# Patient Record
Sex: Female | Born: 1980 | Race: Black or African American | Hispanic: No | Marital: Married | Smoking: Never smoker
Health system: Southern US, Community
[De-identification: ages and names within clinical notes are randomized; demographics above are authoritative.]

## PROBLEM LIST (undated history)

## (undated) DIAGNOSIS — A048 Other specified bacterial intestinal infections: Secondary | ICD-10-CM

## (undated) DIAGNOSIS — K589 Irritable bowel syndrome without diarrhea: Secondary | ICD-10-CM

## (undated) HISTORY — PX: TUBAL LIGATION: SHX77

---

## 2007-01-31 ENCOUNTER — Other Ambulatory Visit: Admission: RE | Admit: 2007-01-31 | Discharge: 2007-01-31 | Payer: Self-pay | Admitting: Obstetrics and Gynecology

## 2007-04-16 ENCOUNTER — Inpatient Hospital Stay (HOSPITAL_COMMUNITY): Admission: AD | Admit: 2007-04-16 | Discharge: 2007-04-16 | Payer: Self-pay | Admitting: Obstetrics and Gynecology

## 2007-04-23 ENCOUNTER — Ambulatory Visit (HOSPITAL_COMMUNITY): Admission: RE | Admit: 2007-04-23 | Discharge: 2007-04-23 | Payer: Self-pay | Admitting: Obstetrics and Gynecology

## 2007-12-03 ENCOUNTER — Inpatient Hospital Stay (HOSPITAL_COMMUNITY): Admission: RE | Admit: 2007-12-03 | Discharge: 2007-12-06 | Payer: Self-pay | Admitting: Obstetrics and Gynecology

## 2007-12-03 ENCOUNTER — Encounter (INDEPENDENT_AMBULATORY_CARE_PROVIDER_SITE_OTHER): Payer: Self-pay | Admitting: Obstetrics and Gynecology

## 2010-09-13 NOTE — H&P (Signed)
NAME:  Jade Mason, Jade Mason                ACCOUNT NO.:  [id number]   MEDICAL RECORD NO.:  [id number]          PATIENT TYPE:  INP   LOCATION:  NA                            [facility]   PHYSICIAN:  Naima A. Dillard, M.D. DATE OF BIRTH:  1980-07-22   DATE OF ADMISSION:  DATE OF DISCHARGE:                              HISTORY & PHYSICAL   CHIEF COMPLAINT:  Term, desires repeat cesarean section and  sterilization.   HISTORY OF PRESENT ILLNESS:  The patient is a 30 year old gravida 5,  para 1-0-3-1, who presents at 39 weeks for repeat cesarean section and  bilateral tubal ligation.  The patient entered pregnancy care at 10  weeks.  Pregnancy has been complicated by a history of a previous  cesarean section.  The patient was offered a cesarean section versus  VBAC and decided to have a repeat cesarean section with tubal ligation.   PRENATAL LABORATORY DATA:  O positive, Rh negative.  Starting hemoglobin  was 12.1 with 414 for platelets.  Sickle cell trait is negative.  RPR is  nonreactive.  Rubella is immune.  Hepatitis B surface antigen is  negative.  HIV is nonreactive.  GC and Chlamydia both negative.  Pap in  October of 2008 was within normal limits and GBS was negative.   PAST OBSTETRICAL HISTORY:  In February of 2005 she had a cesarean  section with delivery of a little girl born with a weight of 8 pounds 8  ounces secondary to fetal distress.  She had an EAB in 2006 with  methotrexate.  In 2006 she had a spontaneous abortion.  In 2007 she had  a spontaneous abortion with a D&C.   PAST MEDICAL HISTORY:  As above.  She has mild asthma.  No history of  intubation.   PAST SURGICAL HISTORY:  Significant for a D&C and breast reduction.  Previous cesarean section.   PAST GYN HISTORY:  Menarche at age 23, occurring every 28 days and  lasting 4-5 days.  History of GC which was treated.  No history of  abnormal Pap smear.   ALLERGIES:  MONISTAT.   MEDICATIONS:  Prenatal vitamins.   FAMILY HISTORY:  Significant for hypertension in maternal grandfather.  Mother with a history of ovarian cancer and father with a history of  depression.   PHYSICAL EXAMINATION:  VITAL SIGNS:  The patient is 5 feet 6 inches and  weighs 190 pounds.  Blood pressure 120/52, pulse 70.  NEUROLOGY:  Within normal limits.  EXTREMITIES:  Trace edema bilaterally.  Thyroid is nontender and not  enlarged.  BREASTS:  No masses or nipple discharge.  HEART:  Regular rate and rhythm.  LUNGS:  Clear to auscultation bilaterally.  ABDOMEN:  Gravid, soft, and nontender measuring 38 cm.  PELVIC:  Gloved vaginal examination is within normal limits.  Cervix  examination is not recorded.  RECTAL:  Deferred.   ASSESSMENT:  1. Term.  2. Desires repeat cesarean section and tubal ligation.   All birth control was reviewed with the patient and the patient desires  tubal ligation.  She understands the failure  rate and risks of a  cesarean section and tubal ligation to be, but not limited to bleeding,  infection, damage to internal organs such as bowel, bladder, and major  blood vessels.  She also understands that if the tubal ligation was to  fail and she thought she was pregnant this could result in ectopic  pregnancy and she should seek help immediately if she thinks she is  pregnant.      Naima A. Normand Sloop, M.D.  Electronically Signed     NAD/MEDQ  D:  12/02/2007  T:  12/03/2007  Job:  [id number]

## 2010-09-13 NOTE — Discharge Summary (Signed)
Jade Mason, Jade Mason               ACCOUNT NO.:  [id number]   MEDICAL RECORD NO.:  [id number]          PATIENT TYPE:  INP   LOCATION:  9147                          [facility]   PHYSICIAN:  Naima A. Dillard, M.D. DATE OF BIRTH:  28-Jan-1981   DATE OF ADMISSION:  12/03/2007  DATE OF DISCHARGE:  12/06/2007                               DISCHARGE SUMMARY   DISCHARGING PHYSICIAN:  Osborn Coho, MD.   ADMISSION DIAGNOSES:  1. Intrauterine pregnancy at term.  2. Desires repeat cesarean section and sterilization.   DISCHARGE DIAGNOSES:  1. Intrauterine pregnancy at term.  2. Desires repeat cesarean section and sterilization.  3. Status post cesarean delivery of a female infant, Apgars 8 and 9.  4. Status post bilateral tubal ligation.   HOSPITAL PROCEDURES:  1. Spinal anesthesia.  2. Repeat low-transverse cesarean section.  3. Bilateral tubal ligation.  4. Lysis of adhesions.  5. Additional suturing of hemorrhagic incision.   HOSPITAL COURSE:  The patient was admitted for an elective repeat  cesarean section which was performed under spinal anesthesia by Dr.  Normand Sloop with Nigel Bridgeman and Dr. Gaynell Face assisting.  She was delivered  of a female infant, Apgars 8 and 9.  Baby was taken to nursery.  Complications included bleeding around the uterine incision requiring  Avitene.  The incision was oversewn and Avitene was applied to control  the bleeding.  The patient was taken to recovery and then to Mother Baby  Unit, where she received routine postop care.  On postop day #1, she was  out of bed, tolerating fluids and food.  IV and PCA were discontinued.  She was voiding after Foley removal without problems.  On postop day #2,  she was doing well up ad lib.  Vital signs were stable, she continued to  receive routine care.  On postop day #3, she was ready to go home.  Baby  was doing well, and she was bottle-feeding her infant.  Chest was clear.  Heart regular rate and rhythm.  Abdomen  was soft and appropriately  tender.  Incision was clean, dry, and intact.  Lochia was small.  Extremities within normal limits.  She was deemed to receive full  benefit of her hospital stay and was discharged home.   DISCHARGE MEDICATIONS:  1. Motrin 600 mg p.o. q.6 h p.r.n.  2. Tylox 1 to 2 p.o. q.4 h p.r.n.   DISCHARGE LABS:  White blood cell count 13.0, hemoglobin 10.4, and  platelets 205.   DISCHARGE INSTRUCTIONS:  Per [facility] handout.   DISCHARGE FOLLOWUP:  In 6 weeks or p.r.n.   CONDITION ON DISCHARGE:  Good      Marie L. Williams, C.N.M.      ______________________________  Pierre Bali Normand Sloop, M.D.    MLW/MEDQ  D:  12/06/2007  T:  12/07/2007  Job:  [id number]

## 2010-09-13 NOTE — H&P (Signed)
NAME:  Jade Mason, Jade Mason                ACCOUNT NO.:  [id number]   MEDICAL RECORD NO.:  [id number]          PATIENT TYPE:  INP   LOCATION:                                [facility]   PHYSICIAN:  Naima A. Dillard, M.D. DATE OF BIRTH:  [birth year]   DATE OF ADMISSION:  12/03/2007  DATE OF DISCHARGE:                              HISTORY & PHYSICAL   Jade Mason is a 30 year old gravida 5, para 1-0-3-1 at 42 weeks who  presents today for scheduled repeat cesarean section, bilateral tubal  ligation.  The patient's pregnancy has been remarkable for:  1. Previous cesarean section with desire for repeat.  2. Desire for tubal sterilization.  3. First trimester spotting.  4. History of breast reduction.  5. History of mild asthma.  6. Strong family history of uterine cancer.  7. History of 2 SABs is and 1 TAB.   PRENATAL LABS:  Blood type is O+, Rh antibody negative, urine  nonreactive, rubella titer positive, hepatitis B surface antigen  negative, HIV is nonreactive.  Sickle cell test was negative.  GC  chlamydia cultures were negative in November.  HSV-2 was negative in  April 2008.  Pap was normal in October 2008.  Hemoglobin upon entering  the practice was 12.1.  It was within normal limits at 28 weeks.  Glucola was normal.  The patient had first trimester screen that was  normal.  AFP was not noted on the record.  Group B strep culture was  negative at 36 weeks.   HISTORY OF PRESENT PREGNANCY:  The patient entered care at approximately  10 weeks.  She had a first trimester ultrasound at [facility] at 7 weeks.  She had some ligament issues in the first trimester.  She was given  Zithromax in the first trimester for upper respiratory infection, first  trimester screen was normal.  The patient about 14 weeks was planning a  repeat C-section and tubal.  AFP was noted that it was done but no  report is noted in the chart.  AFP was normal.  Another ultrasound was  done at 18 weeks showing normal  growth.  There are mild bilateral  pyelectasis.  She had another ultrasound at 27 weeks with pyelectasis  resolved and normal findings.  She had a sebaceous cyst noted in the  vagina at 27 weeks.  Her C-section was scheduled for December 03, 2007,  with Dr. Normand Sloop due to Dr. Stefano Gaul being out of town.  The rest of her  pregnancy was essentially uncomplicated.   OBSTETRICAL HISTORY:  In 2005 she had a primary low transverse cesarean  section for a female infant weight 8 pounds 8 ounces at 40 weeks.  She  was in labor 13 hours.  She has fetal distress is 7 cm.  That child was  born in [place].  In 2006 she had a TAB which she used methotrexate at  6 weeks 2006.  She also had a miscarriage that passed naturally without  complication and in 2007 she had an SAB that required and D and C at 7-8  weeks.  She did have increased bleeding after that.   MEDICAL HISTORY:  She is a previous patch and condom user.  She was  treated for [facility] in September 2008.  She reports usual childhood  illnesses.  She has history of seasonal asthma.  Has albuterol to use,  last attack was in 2006.   SURGICAL HISTORY:  Includes:  1. D and C x1.  2. Breast adduction 2003.  3. Previously noted C-section.   The patient is allergic to MONISTAT was causes a rash.   FAMILY HISTORY:  Her mother, father, maternal grandmother, maternal  grandfather, paternal grandmother and paternal grandfather all have  hypertension.  Paternal grandmother has varicosities.  Her mother has  anemia.  Her mother has ovarian cancer.  Her maternal grandmother,  maternal aunts also had ovarian cancer and all had hysterectomies by age  68.  Her father has paranoia and depression.  Her father was also an  alcoholic and uses drugs and nicotine.   GENETIC HISTORY:  Is remarkable for father of the baby's mother being a  twin.   SOCIAL HISTORY:  The patient is married to the father of baby.  He is  involved and supportive.  His name is Jade Mason.  The patient has a  master's degree.  She is a Runner, broadcasting/film/video.  Her husband has a bachelor's  degree.  He is also a Corporate investment banker.  She has been followed by the Physician  Service [facility].  She denies any alcohol, drug or tobacco  use during this pregnancy.  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   PHYSICAL EXAM:  Vital Signs:  Stable.  The patient is febrile.  HEENT:  Within normal limits.  LUNGS:  Breath sounds are clear.  HEART:  Regular rate and rhythm without murmur.  BREASTS:  Soft and nontender.  ABDOMEN:  Fundal height is approximately 39 cm.  Estimated fetal weight  is 8-8-1/2 pounds.  Uterine contractions are reported as very  occasional.  Fetal heart rate is been in the 140s-150s in the office.  Pelvic exam is deferred.  EXTREMITIES:  Deep to reflexes are 2+ without  clonus.  There is trace edema noted.   IMPRESSION:  1. Intrauterine pregnancy at 39 weeks.  2. Previous cesarean section with desire for repeat.  Also desires      tubal.  3. History of breast reduction.  4. Mild asthma.  5. Strong family history ovarian cancer.   PLAN:  1. Admit to [facility], arrangements per consult with Dr. Jaymes Graff as attending physician.  2. Routine physician preoperative orders as in the admission history      and physical of 20.      Jade Mason Jade Mason, C.N.M.      ______________________________  Pierre Bali Normand Sloop, M.D.    Leeanne Mannan  D:  12/03/2007  T:  12/03/2007  Job:  [id number]

## 2010-09-13 NOTE — Op Note (Signed)
NAMELANDREE, FERNHOLZ               ACCOUNT NO.:  [id number]   MEDICAL RECORD NO.:  [id number]          PATIENT TYPE:  INP   LOCATION:  9147                          [facility]   PHYSICIAN:  Naima A. Dillard, M.D. DATE OF BIRTH:  Aug 22, 1980   DATE OF PROCEDURE:  12/03/2007  DATE OF DISCHARGE:                               OPERATIVE REPORT   PREOPERATIVE DIAGNOSES:  1. Pregnancy at term.  2. Desires repeat cesarean section and sterilization.   POSTOPERATIVE DIAGNOSES:  1. Pregnancy at term.  2. Desires repeat cesarean section and sterilization.   PROCEDURES:  1. Repeat cesarean section.  2. Bilateral tubal ligation with lysis of adhesions.   SURGEON:  Naima A. Normand Sloop, MD   ASSISTANT:  1. Renaldo Reel. Emilee Hero, C.N.M.  2. Kathreen Cosier, MD   ANESTHESIA:  Spinal.   FINDINGS:  A female infant in vertex presentation with clear fluid, Apgars  of 8 and 9.  Placenta was sent to Labor and Delivery.  Findings were  pelvic and abdominal adhesions and the findings noted above.   ESTIMATED BLOOD LOSS:  900 mL.   URINE OUTPUT:  500 mL clear urine at the end of procedure.   INTRAVENOUS FLUIDS:  4 L of crystalloid.   COMPLICATIONS:  Bleeding around the uterine incision requiring Avitene.  The patient went to PACU in stable condition.   PROCEDURE IN DETAIL:  The patient was taken to the operating room where  she was given spinal anesthesia, placed in dorsal supine position with a  left lateral tilt.  Foley catheter was placed.  An incision was made  along her previous incision 2 cm above the symphysis pubis and taken  down to the fascia.  The fascia was incised in the midline and extended  bilaterally, Kocher's x2 placed on the superior aspect, fascia was  dissected off the rectus muscle both sharply and bluntly.  The inferior  aspect of the rectus muscle was dissected in a similar fashion.  The  peritoneum was identified, tented up, entered sharply, and then extended  bilaterally.  The bladder was adherent to the uterus.  A bladder flap  was then created with the Metzenbaum scissors and digitally, a bladder  blade was inserted.  A low transverse uterine incision was made with the  scalpel and extended bluntly, with clear fluid.  The infant was  delivered without difficulty.  Mouth and nares were bulb suctioned.  Body was delivered.  The cord was clamped and cut.  The placenta was  delivered without difficulty.  The uterus was cleared of all clot and  debris.  Uterine incision was repaired with 0 Vicryl.  A second layer of  0 Vicryl was used to imbricate the uterus.  The patient's left fallopian  tube was grasped with a Babcock clamp, followed out to the fimbriated  end, about a centimeter of the mid isthmic portion of the tube was  clamped, this was cut and tied with 2-0 plain and excised.  Hemostasis  was noted.  The patient's right fallopian tube was grasped with a  Babcock clamp and suture ligated  with 2-0 plain, and about a centimeter  of the tube was excised and sent to Pathology.  Hemostasis was assured.  Attention was then turned to the incision, where there was bleeding all  along the uterine incision.  I tried to stop with figure-of-eight suture  using 0 Vicryl and 3-0 Vicryl on an SH and Surgicel.  After about 45  minutes of doing this, the area still continued to bleed.  I did call in  a colleague.  Dr. Gaynell Face came in, and he helped to oversew the uterine  incision with 0 chromic.  It then began to just bleed slightly.  We  applied pressure, and the bleeding slowed down.  We then applied  Avitene, and then the bleeding finally stopped.  The peritoneum was then  closed using 0 chromic.  The muscles were reapproximated using 0  chromic.  The muscles were irrigated and noted to be hemostatic.  The  fascia was closed using 0 Vicryl in a running fashion.  The subcutaneous  tissue was reapproximated using 2-0 plain after it was made hemostatic   with Bovie cautery, and the skin was closed using 3-0 Monocryl in a  subcuticular fashion.  Sponge, lap, and needle counts were correct.  The  patient went to Recovery Room in stable condition.      Naima A. Normand Sloop, M.D.  Electronically Signed     NAD/MEDQ  D:  12/03/2007  T:  12/04/2007  Job:  [id number]

## 2011-01-27 LAB — CBC
HCT: 31.6 — ABNORMAL LOW
Hemoglobin: 10.4 — ABNORMAL LOW
MCHC: 32.7
MCV: 87.4
Platelets: 238
RBC: 3.61 — ABNORMAL LOW
RBC: 4.55
RDW: 15.5
RDW: 15.5

## 2011-01-27 LAB — HEMOGLOBIN AND HEMATOCRIT, BLOOD: Hemoglobin: 11.1 — ABNORMAL LOW

## 2011-01-27 LAB — RPR: RPR Ser Ql: NONREACTIVE

## 2011-02-03 LAB — HCG, QUANTITATIVE, PREGNANCY: hCG, Beta Chain, Quant, S: 57902 — ABNORMAL HIGH

## 2011-02-03 LAB — CBC
Hemoglobin: 12.8
WBC: 10.4

## 2011-02-03 LAB — DIFFERENTIAL
Basophils Absolute: 0
Basophils Relative: 0
Eosinophils Absolute: 0.1 — ABNORMAL LOW
Lymphs Abs: 2.7
Monocytes Absolute: 0.7

## 2011-05-23 ENCOUNTER — Telehealth (HOSPITAL_COMMUNITY): Payer: Self-pay | Admitting: *Deleted

## 2011-06-08 ENCOUNTER — Telehealth (HOSPITAL_COMMUNITY): Payer: Self-pay | Admitting: *Deleted

## 2014-02-03 ENCOUNTER — Emergency Department: Payer: Self-pay | Admitting: Emergency Medicine
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
## 2018-05-30 ENCOUNTER — Other Ambulatory Visit: Payer: Self-pay

## 2018-05-30 ENCOUNTER — Emergency Department (HOSPITAL_BASED_OUTPATIENT_CLINIC_OR_DEPARTMENT_OTHER)
Admission: EM | Admit: 2018-05-30 | Discharge: 2018-05-30 | Disposition: A | Discharge status: Home / Self Care | Payer: BLUE CROSS/BLUE SHIELD | Attending: Emergency Medicine | Admitting: Emergency Medicine

## 2018-05-30 ENCOUNTER — Encounter (HOSPITAL_BASED_OUTPATIENT_CLINIC_OR_DEPARTMENT_OTHER): Payer: Self-pay

## 2018-05-30 DIAGNOSIS — K2900 Acute gastritis without bleeding: Secondary | ICD-10-CM | POA: Diagnosis not present

## 2018-05-30 DIAGNOSIS — R1013 Epigastric pain: Secondary | ICD-10-CM | POA: Diagnosis present

## 2018-05-30 HISTORY — DX: Irritable bowel syndrome, unspecified: K58.9

## 2018-05-30 HISTORY — DX: Other specified bacterial intestinal infections: A04.8

## 2018-05-30 MED ORDER — LIDOCAINE VISCOUS HCL 2 % MT SOLN
15.0000 mL | Freq: Once | OROMUCOSAL | Status: AC
  Administered 2018-05-30: 15 mL via ORAL
  Filled 2018-05-30: qty 15

## 2018-05-30 MED ORDER — SUCRALFATE 1 GM/10ML PO SUSP: 1.0000 g | Freq: Three times a day (TID) | ORAL | 0 refills | Status: AC

## 2018-05-30 MED ORDER — HYDROMORPHONE HCL 1 MG/ML IJ SOLN
1.0000 mg | Freq: Once | INTRAMUSCULAR | Status: AC
  Administered 2018-05-30: 1 mg via INTRAMUSCULAR
  Filled 2018-05-30: qty 1

## 2018-05-30 MED ORDER — ONDANSETRON 8 MG PO TBDP
8.0000 mg | ORAL_TABLET | Freq: Once | ORAL | Status: AC
  Administered 2018-05-30: 8 mg via ORAL
  Filled 2018-05-30: qty 1

## 2018-05-30 MED ORDER — HYDROCODONE-ACETAMINOPHEN 5-325 MG PO TABS: 1.0000 | ORAL_TABLET | ORAL | 0 refills | Status: AC | PRN

## 2018-05-30 MED ORDER — ALUM & MAG HYDROXIDE-SIMETH 200-200-20 MG/5ML PO SUSP
30.0000 mL | Freq: Once | ORAL | Status: AC
  Administered 2018-05-30: 30 mL via ORAL
  Filled 2018-05-30: qty 30

## 2018-05-30 NOTE — Discharge Instructions (Signed)
Follow-up with your gastroenterologist as needed.  If you have vomiting blood, passage of black stools, weakness, dizziness and passing out, return to the ER.

## 2018-05-30 NOTE — ED Provider Notes (Signed)
**Note Jade-Identified via Obfuscation** [facility] EMERGENCY DEPARTMENT Provider Note   CSN: [id number] Arrival date & time: 05/30/18  2206     History   Chief Complaint Chief Complaint  Patient presents with  . Abdominal Pain    HPI Jade Mason is a 38 y.o. female.  Patient presents to the emergency department for evaluation of abdominal pain.  Patient experiencing epigastric pain for approximately a week.  She reports that initially the pain was only intermittent, mostly when her stomach was empty.  The pain is now continuous.  Patient reports that she saw her gastroenterologist 2 days ago.  She had blood work, CT scan which were unremarkable.  She had a breath test that was positive for H. pylori.  She was started on treatment for H. pylori.  She did not, however, start her omeprazole until today.  She has not had any hematemesis, melena, hematochezia.     Past Medical History:  Diagnosis Date  . H. pylori infection   . IBS (irritable bowel syndrome)     There are no active problems to display for this patient.   Past Surgical History:  Procedure Laterality Date  . CESAREAN SECTION    . TUBAL LIGATION       OB History   No obstetric history on file.      Home Medications    Prior to Admission medications   Medication Sig Start Date End Date Taking? Authorizing Provider  HYDROcodone-acetaminophen (NORCO/VICODIN) 5-325 MG tablet Take 1-2 tablets by mouth every 4 (four) hours as needed for moderate pain. 05/30/18   Gilda CreasePollina, Raja Caputi J, MD  sucralfate (CARAFATE) 1 GM/10ML suspension Take 10 mLs (1 g total) by mouth 4 (four) times daily -  with meals and at bedtime. 05/30/18   Gilda CreasePollina, Nayib Remer J, MD    Family History No family history on file.  Social History Social History   Tobacco Use  . Smoking status: Never Smoker  . Smokeless tobacco: Never Used  Substance Use Topics  . Alcohol use: Yes    Comment: occ  . Drug use: Never     Allergies   Monistat  [miconazole]   Review of Systems Review of Systems  Gastrointestinal: Positive for abdominal pain.  All other systems reviewed and are negative.    Physical Exam Updated Vital Signs BP (!) 131/96 (BP Location: Left Arm)   Pulse 84   Temp 98.3 F (36.8 C) (Oral)   Resp 16   Ht 5\' 5"  (1.651 m)   Wt 73.5 kg   LMP 05/16/2018 (Approximate)   SpO2 100%   BMI 26.96 kg/m   Physical Exam Vitals signs and nursing note reviewed.  Constitutional:      General: She is not in acute distress.    Appearance: Normal appearance. She is well-developed.  HENT:     Head: Normocephalic and atraumatic.     Right Ear: Hearing normal.     Left Ear: Hearing normal.     Nose: Nose normal.  Eyes:     Conjunctiva/sclera: Conjunctivae normal.     Pupils: Pupils are equal, round, and reactive to light.  Neck:     Musculoskeletal: Normal range of motion and neck supple.  Cardiovascular:     Rate and Rhythm: Regular rhythm.     Heart sounds: S1 normal and S2 normal. No murmur. No friction rub. No gallop.   Pulmonary:     Effort: Pulmonary effort is normal. No respiratory distress.     Breath sounds: Normal breath  sounds.  Chest:     Chest wall: No tenderness.  Abdominal:     General: Bowel sounds are normal.     Palpations: Abdomen is soft.     Tenderness: There is abdominal tenderness in the epigastric area. There is no guarding or rebound. Negative signs include Murphy's sign and McBurney's sign.     Hernia: No hernia is present.  Musculoskeletal: Normal range of motion.  Skin:    General: Skin is warm and dry.     Findings: No rash.  Neurological:     Mental Status: She is alert and oriented to person, place, and time.     GCS: GCS eye subscore is 4. GCS verbal subscore is 5. GCS motor subscore is 6.     Cranial Nerves: No cranial nerve deficit.     Sensory: No sensory deficit.     Coordination: Coordination normal.  Psychiatric:        Speech: Speech normal.        Behavior:  Behavior normal.        Thought Content: Thought content normal.      ED Treatments / Results  Labs (all labs ordered are listed, but only abnormal results are displayed) Labs Reviewed - No data to display  EKG None  Radiology No results found.  Procedures Procedures (including critical care time)  Medications Ordered in ED Medications  HYDROmorphone (DILAUDID) injection 1 mg (has no administration in time range)  ondansetron (ZOFRAN-ODT) disintegrating tablet 8 mg (has no administration in time range)  alum & mag hydroxide-simeth (MAALOX/MYLANTA) 200-200-20 MG/5ML suspension 30 mL (has no administration in time range)    And  lidocaine (XYLOCAINE) 2 % viscous mouth solution 15 mL (has no administration in time range)     Initial Impression / Assessment and Plan / ED Course  I have reviewed the triage vital signs and the nursing notes.  Pertinent labs & imaging results that were available during my care of the patient were reviewed by me and considered in my medical decision making (see chart for details).     Patient presents with persistent abdominal pain.  Patient's pain is epigastric.  She does not have right upper quadrant tenderness to suggest gallbladder disease.  No lower abdominal tenderness.  Records from [facility] were accessed.  CT scan 2 days ago was unremarkable.  Blood work was also unremarkable.  Patient is on appropriate treatment for H. pylori but likely still having pain from gastritis/ulcer.  Will treat with analgesia, Carafate.  Follow-up with gastroenterology.  Final Clinical Impressions(s) / ED Diagnoses   Final diagnoses:  Acute gastritis without hemorrhage, unspecified gastritis type    ED Discharge Orders         Ordered    sucralfate (CARAFATE) 1 GM/10ML suspension  3 times daily with meals & bedtime     05/30/18 2319    HYDROcodone-acetaminophen (NORCO/VICODIN) 5-325 MG tablet  Every 4 hours PRN     05/30/18 2319           Gilda CreasePollina,  Doryce Mcgregory J, MD 05/30/18 2321

## 2018-05-30 NOTE — ED Triage Notes (Signed)
C/o abd pain x 1 week-seen by [facility] 2 days-had labs and CT scan-dx with hpylori-started on meds-NAD-steady gait

## 2018-05-31 ENCOUNTER — Telehealth (HOSPITAL_BASED_OUTPATIENT_CLINIC_OR_DEPARTMENT_OTHER): Payer: Self-pay | Admitting: Emergency Medicine

## 2018-05-31 MED ORDER — SUCRALFATE 1 G PO TABS: 1.0000 g | ORAL_TABLET | Freq: Three times a day (TID) | ORAL | 0 refills | Status: AC

## 2018-05-31 NOTE — Telephone Encounter (Signed)
Received call from patient's husband stating the sucralfate liquid was not covered by insurance and too expensive. Consulted with Dr. Blinda Leatherwood who escribed in sucralfate tabs which should be less expensive. Advised pt that if second prescription was too expensive to have pt take OTC pepcid 2 times daily. Husband verbalized understanding.
